# Patient Record
Sex: Female | Born: 1960 | Race: White | Hispanic: No | Marital: Married
Health system: Southern US, Community
[De-identification: ages and names within clinical notes are randomized; demographics above are authoritative.]

---

## 2005-11-09 ENCOUNTER — Other Ambulatory Visit: Admission: RE | Admit: 2005-11-09 | Discharge: 2005-11-09 | Payer: Self-pay | Admitting: Family Medicine

## 2005-11-19 ENCOUNTER — Encounter: Admission: RE | Admit: 2005-11-19 | Discharge: 2005-11-19 | Payer: Self-pay | Admitting: Family Medicine

## 2008-10-11 ENCOUNTER — Other Ambulatory Visit: Admission: RE | Admit: 2008-10-11 | Discharge: 2008-10-11 | Payer: Self-pay | Admitting: Family Medicine

## 2009-10-01 ENCOUNTER — Other Ambulatory Visit: Admission: RE | Admit: 2009-10-01 | Discharge: 2009-10-01 | Payer: Self-pay | Admitting: Family Medicine

## 2009-10-07 ENCOUNTER — Encounter: Admission: RE | Admit: 2009-10-07 | Discharge: 2009-10-07 | Payer: Self-pay | Admitting: Family Medicine

## 2009-10-15 ENCOUNTER — Encounter: Admission: RE | Admit: 2009-10-15 | Discharge: 2009-10-15 | Payer: Self-pay | Admitting: Family Medicine

## 2009-10-20 ENCOUNTER — Encounter: Admission: RE | Admit: 2009-10-20 | Discharge: 2009-10-20 | Payer: Self-pay | Admitting: Family Medicine

## 2009-12-05 ENCOUNTER — Encounter: Admission: RE | Admit: 2009-12-05 | Discharge: 2009-12-05 | Payer: Self-pay | Admitting: Surgery

## 2009-12-05 ENCOUNTER — Ambulatory Visit (HOSPITAL_COMMUNITY): Admission: RE | Admit: 2009-12-05 | Discharge: 2009-12-05 | Payer: Self-pay | Admitting: Surgery

## 2010-04-22 LAB — CBC
HCT: 36.2 % (ref 36.0–46.0)
Hemoglobin: 12.1 g/dL (ref 12.0–15.0)
MCV: 87.4 fL (ref 78.0–100.0)
RBC: 4.14 MIL/uL (ref 3.87–5.11)
WBC: 5.1 10*3/uL (ref 4.0–10.5)

## 2010-08-20 ENCOUNTER — Other Ambulatory Visit: Payer: Self-pay | Admitting: Family Medicine

## 2010-08-20 DIAGNOSIS — N644 Mastodynia: Secondary | ICD-10-CM

## 2010-09-15 ENCOUNTER — Other Ambulatory Visit (HOSPITAL_COMMUNITY)
Admission: RE | Admit: 2010-09-15 | Discharge: 2010-09-15 | Disposition: A | Discharge status: Home / Self Care | Payer: BC Managed Care – PPO | Source: Ambulatory Visit | Attending: Family Medicine | Admitting: Family Medicine

## 2010-09-15 ENCOUNTER — Other Ambulatory Visit: Payer: Self-pay | Admitting: Family Medicine

## 2010-09-15 DIAGNOSIS — Z0000 Encounter for general adult medical examination without abnormal findings: Secondary | ICD-10-CM | POA: Insufficient documentation

## 2010-09-16 ENCOUNTER — Ambulatory Visit
Admission: RE | Admit: 2010-09-16 | Discharge: 2010-09-16 | Disposition: A | Discharge status: Home / Self Care | Payer: Self-pay | Source: Ambulatory Visit | Attending: Family Medicine | Admitting: Family Medicine

## 2010-09-16 DIAGNOSIS — N644 Mastodynia: Secondary | ICD-10-CM

## 2011-08-19 ENCOUNTER — Other Ambulatory Visit: Payer: Self-pay | Admitting: Family Medicine

## 2011-08-19 DIAGNOSIS — Z1231 Encounter for screening mammogram for malignant neoplasm of breast: Secondary | ICD-10-CM

## 2011-09-20 ENCOUNTER — Ambulatory Visit
Admission: RE | Admit: 2011-09-20 | Discharge: 2011-09-20 | Disposition: A | Discharge status: Home / Self Care | Payer: 59 | Source: Ambulatory Visit | Attending: Family Medicine | Admitting: Family Medicine

## 2011-09-20 DIAGNOSIS — Z1231 Encounter for screening mammogram for malignant neoplasm of breast: Secondary | ICD-10-CM

## 2012-08-30 ENCOUNTER — Other Ambulatory Visit: Payer: Self-pay

## 2012-08-30 DIAGNOSIS — Z1231 Encounter for screening mammogram for malignant neoplasm of breast: Secondary | ICD-10-CM

## 2012-09-20 ENCOUNTER — Ambulatory Visit
Admission: RE | Admit: 2012-09-20 | Discharge: 2012-09-20 | Disposition: A | Discharge status: Home / Self Care | Payer: Self-pay | Source: Ambulatory Visit

## 2012-09-20 DIAGNOSIS — Z1231 Encounter for screening mammogram for malignant neoplasm of breast: Secondary | ICD-10-CM

## 2013-09-27 ENCOUNTER — Other Ambulatory Visit: Payer: Self-pay

## 2013-09-27 DIAGNOSIS — Z1231 Encounter for screening mammogram for malignant neoplasm of breast: Secondary | ICD-10-CM

## 2013-10-08 ENCOUNTER — Ambulatory Visit
Admission: RE | Admit: 2013-10-08 | Discharge: 2013-10-08 | Disposition: A | Discharge status: Home / Self Care | Payer: BC Managed Care – PPO | Source: Ambulatory Visit

## 2013-10-08 DIAGNOSIS — Z1231 Encounter for screening mammogram for malignant neoplasm of breast: Secondary | ICD-10-CM

## 2013-11-28 ENCOUNTER — Other Ambulatory Visit: Payer: Self-pay | Admitting: Family Medicine

## 2013-11-28 ENCOUNTER — Other Ambulatory Visit (HOSPITAL_COMMUNITY)
Admission: RE | Admit: 2013-11-28 | Discharge: 2013-11-28 | Disposition: A | Discharge status: Home / Self Care | Payer: BC Managed Care – PPO | Source: Ambulatory Visit | Attending: Family Medicine | Admitting: Family Medicine

## 2013-11-28 DIAGNOSIS — Z0000 Encounter for general adult medical examination without abnormal findings: Secondary | ICD-10-CM | POA: Insufficient documentation

## 2013-11-29 LAB — CYTOLOGY - PAP

## 2014-09-16 ENCOUNTER — Other Ambulatory Visit: Payer: Self-pay

## 2014-09-16 DIAGNOSIS — Z1231 Encounter for screening mammogram for malignant neoplasm of breast: Secondary | ICD-10-CM

## 2014-10-11 ENCOUNTER — Ambulatory Visit: Payer: BC Managed Care – PPO

## 2014-10-25 ENCOUNTER — Ambulatory Visit
Admission: RE | Admit: 2014-10-25 | Discharge: 2014-10-25 | Disposition: A | Discharge status: Home / Self Care | Payer: BC Managed Care – PPO | Source: Ambulatory Visit

## 2014-10-25 DIAGNOSIS — Z1231 Encounter for screening mammogram for malignant neoplasm of breast: Secondary | ICD-10-CM

## 2015-07-22 ENCOUNTER — Other Ambulatory Visit: Payer: Self-pay | Admitting: Family Medicine

## 2015-07-22 DIAGNOSIS — Z1231 Encounter for screening mammogram for malignant neoplasm of breast: Secondary | ICD-10-CM

## 2015-10-31 ENCOUNTER — Ambulatory Visit
Admission: RE | Admit: 2015-10-31 | Discharge: 2015-10-31 | Disposition: A | Discharge status: Home / Self Care | Payer: BC Managed Care – PPO | Source: Ambulatory Visit | Attending: Family Medicine | Admitting: Family Medicine

## 2015-10-31 DIAGNOSIS — Z1231 Encounter for screening mammogram for malignant neoplasm of breast: Secondary | ICD-10-CM

## 2016-01-28 ENCOUNTER — Other Ambulatory Visit (HOSPITAL_COMMUNITY)
Admission: RE | Admit: 2016-01-28 | Discharge: 2016-01-28 | Disposition: A | Discharge status: Home / Self Care | Payer: BC Managed Care – PPO | Source: Ambulatory Visit | Attending: Family Medicine | Admitting: Family Medicine

## 2016-01-28 ENCOUNTER — Other Ambulatory Visit: Payer: Self-pay | Admitting: Family Medicine

## 2016-01-28 DIAGNOSIS — Z1151 Encounter for screening for human papillomavirus (HPV): Secondary | ICD-10-CM | POA: Insufficient documentation

## 2016-01-28 DIAGNOSIS — Z01411 Encounter for gynecological examination (general) (routine) with abnormal findings: Secondary | ICD-10-CM | POA: Diagnosis present

## 2016-01-30 LAB — CYTOLOGY - PAP
Diagnosis: NEGATIVE
HPV: NOT DETECTED

## 2016-09-13 ENCOUNTER — Ambulatory Visit: Payer: BC Managed Care – PPO

## 2016-09-14 ENCOUNTER — Ambulatory Visit (INDEPENDENT_AMBULATORY_CARE_PROVIDER_SITE_OTHER): Payer: BC Managed Care – PPO | Admitting: Physical Therapy

## 2016-09-14 DIAGNOSIS — M25522 Pain in left elbow: Secondary | ICD-10-CM | POA: Diagnosis not present

## 2016-09-14 DIAGNOSIS — M79632 Pain in left forearm: Secondary | ICD-10-CM

## 2016-09-14 DIAGNOSIS — M25632 Stiffness of left wrist, not elsewhere classified: Secondary | ICD-10-CM | POA: Diagnosis not present

## 2016-09-14 NOTE — Patient Instructions (Signed)
Composite Extension (Passive Flexor Stretch)    Sitting with elbows on table and palms together, slowly lower wrists toward table until stretch is felt. Be sure to keep palms together throughout stretch. Hold _30___ seconds. Relax. Repeat __3__ times. Do __2-3__ sessions per day.  Wrist Passive Flexion    Rest right forearm on table, hand palm-down over edge. Bend wrist by pressing hand down with other hand. Hold __30__ seconds. Repeat _3___ times. Do __2-3__ sessions per day.  Forearm Pronation / Supination: Resisted (Sitting)    With right forearm supported, grasp object and gently rotate palm up, then down, as far as possible without pain. Repeat _10-15___ times per set. Do __1__ sets per session. Do __2-3__ sessions per day.  Towel Roll Squeeze    With right forearm resting on surface, gently squeeze towel. Repeat __10-15__ times per set. Do _1___ sets per session. Do __2-3__ sessions per day.

## 2016-09-15 NOTE — Therapy (Addendum)
[facility] [address] [place], [place], [address] Phone: [phone]   Fax:  [phone]  Physical Therapy Evaluation  Patient Details  Name: Darlene RoverBarbara B Davila MRN: [id number] Date of Birth: Dec 14, 1960 Referring Provider: Dr. Deatra JamesVyvyan Sun  Encounter Date: 09/14/2016      PT End of Session - 09/15/16 0857    Visit Number 1   Number of Visits 6   Date for PT Re-Evaluation 10/26/16   Authorization Type BCBS   PT Start Time 1604   PT Stop Time 1643   PT Time Calculation (min) 39 min   Activity Tolerance Patient tolerated treatment well   Behavior [facility] for tasks assessed/performed      No past medical history on file.  No past surgical history on file.  There were no vitals filed for this visit.       Subjective Assessment - 09/14/16 1609    Subjective Pt is a 56 y/o female who presents to OPPT for LUE pain following fall on 07/23/16.  X-rays negative for fracture.  Pt reports continued pain and tenderness as well as ROM limitations.  Pt reports occasional numbness mostly in 5th digit.   Limitations Other (comment)  ADLs   Diagnostic tests x-rays negative   Patient Stated Goals restore ROM, improve pain    Currently in Pain? Yes   Pain Score 0-No pain   Pain Location Elbow   Pain Orientation Left   Pain Descriptors / Indicators Pins and needles   Pain Type Chronic pain;Acute pain   Pain Onset More than a month ago   Pain Frequency Intermittent   Aggravating Factors  sudden movement with hand, pronation/supination, playing piano   Pain Relieving Factors avoiding movement            [facility] PT Assessment - 09/14/16 1615      Assessment   Medical Diagnosis Lt forearm contusion   Referring Provider Dr. Deatra JamesVyvyan Sun   Onset Date/Surgical Date 07/23/16   Hand Dominance Right   Next MD Visit PRN   Prior Therapy none     Precautions   Precautions None     Restrictions   Weight Bearing Restrictions No      Balance Screen   Has the patient fallen in the past 6 months Yes   How many times? 1   Has the patient had a decrease in activity level because of a fear of falling?  Yes   Is the patient reluctant to leave their home because of a fear of falling?  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 [employer]   Leisure reading, walking, piano, hiking     Cognition   Overall Cognitive Status Within Functional Limits for tasks assessed     Posture/Postural Control   Posture/Postural Control Postural limitations   Postural Limitations Rounded Shoulders;Forward head     AROM   Overall AROM Comments Rt wrist WNL   AROM Assessment Site Wrist;Forearm   Right/Left Forearm Left   Left Forearm Pronation 68 Degrees   Left Forearm Supination 80 Degrees   Right/Left Wrist Left   Left Wrist Extension 40 Degrees   Left Wrist Flexion 37 Degrees   Left Wrist Radial Deviation 9 Degrees   Left Wrist Ulnar Deviation 17  Degrees     Strength   Strength Assessment Site Hand   Right/Left hand Right;Left   Right Hand Grip (lbs) 69.67  76, 68, 65   Left Hand Grip (lbs) 37  35, 38, 38     Palpation   Palpation comment tenderness Lt distal elbow            Objective measurements completed on examination: See above findings.          [facility] Adult PT Treatment/Exercise - 09/14/16 1615      Exercises   Exercises Wrist;Hand     Hand Exercises   Other Hand Exercises instructed in towel squeeze for HEP     Wrist Exercises   Forearm Supination AAROM;Left;5 reps   Forearm Supination Limitations for HEP instruction   Forearm Pronation AAROM;Left;5 reps   Forearm Pronation Limitations for HEP instruction   Other wrist exercises wrist flexion/extension stretch 1 rep x 30 sec on Lt for HEP  instruction                PT Education - 09/15/16 0855    Education provided Yes   Education Details HEP   Person(s) Educated Patient   Methods Explanation;Demonstration;Handout;Verbal cues   Comprehension Verbalized understanding;Returned demonstration;Need further instruction             PT Long Term Goals - 09/15/16 0909      PT LONG TERM GOAL #1   Title independent with HEP   Time 6   Period Weeks   Status New   Target Date 10/26/16     PT LONG TERM GOAL #2   Title improve Lt grip strength to at least 45# for improved strength   Time 6   Period Weeks   Status New   Target Date 10/26/16     PT LONG TERM GOAL #3   Title improve Lt wrist flexion and extension to at least 50 degrees for improved function   Time 6   Period Weeks   Status New   Target Date 10/26/16     PT LONG TERM GOAL #4   Title improve Lt forearm supination/pronation to WNL for improved function   Time 6   Period Weeks   Status New   Target Date 10/26/16                Plan - 09/15/16 0900    Clinical Impression Statement Pt is a 56 y/o female who presents to OPPT s/p fall resulting in LUE pain and decreased use.  Pt demonstrates pain, decreased strength and ROM, and decreased use of LUE affecting ADLs.  Pt will benefit from PT to address these deficits.   Clinical Presentation Stable   Clinical Decision Making Low   Rehab Potential Good   Clinical Impairments Affecting Rehab Potential pt requesting limited visits due to finances and work   PT Frequency 1x / week  pt recommending biweekly with option to see 1x/wk depending on progress   PT Duration 6 weeks   PT Treatment/Interventions ADLs/Self Care Home Management;Cryotherapy;Electrical Stimulation;Moist Heat;Ultrasound;Therapeutic exercise;Therapeutic activities;Functional mobility training;Patient/family education;Manual techniques;Taping;Passive range of motion;Dry needling   PT Next Visit Plan review HEP, manual for  ROM, modalities and strengthening PRN   Consulted and Agree with Plan of Care Patient      Patient will benefit from skilled therapeutic intervention in order to improve the following deficits and impairments:  Decreased strength, Pain, Impaired UE functional use, Decreased range of motion, Impaired flexibility, Increased edema  Visit  Diagnosis: Pain in left elbow - Plan: PT plan of care cert/re-cert  Pain in left forearm - Plan: PT plan of care cert/re-cert  Stiffness of left wrist, not elsewhere classified - Plan: PT plan of care cert/re-cert     Problem List There are no active problems to display for this patient.    Clarita Crane, PT, DPT 09/15/16 9:27 AM    [facility] [address] [place], [place], [address] Phone: [phone]   Fax:  [phone]  Name: NADELYN ENRIQUES MRN: [id number] Date of Birth: [birth year]

## 2016-09-21 ENCOUNTER — Other Ambulatory Visit: Payer: Self-pay | Admitting: Family Medicine

## 2016-09-21 DIAGNOSIS — Z1231 Encounter for screening mammogram for malignant neoplasm of breast: Secondary | ICD-10-CM

## 2016-09-27 ENCOUNTER — Encounter: Payer: Self-pay | Admitting: Physical Therapy

## 2016-09-27 ENCOUNTER — Ambulatory Visit (INDEPENDENT_AMBULATORY_CARE_PROVIDER_SITE_OTHER): Payer: BC Managed Care – PPO | Admitting: Physical Therapy

## 2016-09-27 DIAGNOSIS — M25522 Pain in left elbow: Secondary | ICD-10-CM

## 2016-09-27 DIAGNOSIS — M79632 Pain in left forearm: Secondary | ICD-10-CM | POA: Diagnosis not present

## 2016-09-27 DIAGNOSIS — M25632 Stiffness of left wrist, not elsewhere classified: Secondary | ICD-10-CM | POA: Diagnosis not present

## 2016-09-27 NOTE — Therapy (Signed)
[facility] [address] [place], [place], [address] Phone: [phone]   Fax:  [phone]  Physical Therapy Treatment  Patient Details  Name: Darlene Davila MRN: [id number] Date of Birth: July 24, 1960 Referring Provider: Dr. Deatra James  Encounter Date: 09/27/2016      PT End of Session - 09/27/16 1700    Visit Number 2   Number of Visits 6   Date for PT Re-Evaluation 10/26/16   Authorization Type BCBS   PT Start Time 1605   PT Stop Time 1648   PT Time Calculation (min) 43 min   Activity Tolerance Patient tolerated treatment well   Behavior During Therapy [facility] for tasks assessed/performed      History reviewed. No pertinent past medical history.  History reviewed. No pertinent surgical history.  There were no vitals filed for this visit.      Subjective Assessment - 09/27/16 1626    Subjective Pt has been doing HEP, feels that she is doing better with ROM, and is using hand some at home as well.    Currently in Pain? Yes   Pain Score 2    Pain Location Elbow   Pain Descriptors / Indicators Aching   Pain Onset More than a month ago   Pain Frequency Intermittent   Aggravating Factors  increased activity, piano, pron/sup and wrist flexion   Pain Relieving Factors rest                         [facility] Adult PT Treatment/Exercise - 09/27/16 1630      Elbow Exercises   Elbow Flexion Strengthening;Left;20 reps;Standing   Elbow Flexion Limitations 1lb   Forearm Supination AROM;Left;20 reps   Forearm Pronation AROM;20 reps   Wrist Flexion AROM;20 reps   Wrist Extension AROM;20 reps   Other elbow exercises Rows, Red t-band, standing x 20      Wrist Exercises   Other wrist exercises Wrist flexion stretch/off table 30 sec x 3      Manual Therapy   Manual Therapy Passive ROM   Passive ROM L elbow and wrist, all motions                PT Education - 09/27/16 1700    Education provided Yes   Person(s) Educated Patient   Methods Explanation;Demonstration   Comprehension Verbalized understanding;Need further instruction             PT Long Term Goals - 09/15/16 0909      PT LONG TERM GOAL #1   Title independent with HEP   Time 6   Period Weeks   Status New   Target Date 10/26/16     PT LONG TERM GOAL #2   Title improve Lt grip strength to at least 45# for improved strength   Time 6   Period Weeks   Status New   Target Date 10/26/16     PT LONG TERM GOAL #3   Title improve Lt wrist flexion and extension to at least 50 degrees for improved function   Time 6   Period Weeks   Status New   Target Date 10/26/16     PT LONG TERM GOAL #4   Title improve Lt forearm supination/pronation to WNL for improved function   Time 6   Period Weeks   Status New   Target Date 10/26/16               Plan -  09/27/16 1701    Clinical Impression Statement Pt continues to have stiffness in elbow and wrist, addressed with manual therapy and ther ex today. She has soreness at lateral elbow and dorsal wrist with ther ex and stretching. Pt given further instruction on HEP with recommendations to continue to focus on stretching and ROM.    Rehab Potential Good   Clinical Impairments Affecting Rehab Potential pt requesting limited visits due to finances and work   PT Frequency 1x / week  pt recommending biweekly with option to see 1x/wk depending on progress   PT Duration 6 weeks   PT Treatment/Interventions ADLs/Self Care Home Management;Cryotherapy;Electrical Stimulation;Moist Heat;Ultrasound;Therapeutic exercise;Therapeutic activities;Functional mobility training;Patient/family education;Manual techniques;Taping;Passive range of motion;Dry needling   PT Next Visit Plan review HEP, manual for ROM, modalities and strengthening PRN   Consulted and Agree with Plan of Care Patient      Patient will benefit from skilled therapeutic intervention in order to improve the following  deficits and impairments:  Decreased strength, Pain, Impaired UE functional use, Decreased range of motion, Impaired flexibility, Increased edema  Visit Diagnosis: Pain in left elbow  Pain in left forearm  Stiffness of left wrist, not elsewhere classified     Problem List There are no active problems to display for this patient.   Sedalia Muta, PT, DPT  09/27/2016, 5:04 PM      [facility] [address] [place], [place], [address] Phone: [phone]   Fax:  [phone]  Name: RANEA RENEGAR MRN: [id number] Date of Birth: Mar 01, 1960

## 2016-10-13 ENCOUNTER — Ambulatory Visit (INDEPENDENT_AMBULATORY_CARE_PROVIDER_SITE_OTHER): Payer: BC Managed Care – PPO | Admitting: Physical Therapy

## 2016-10-13 DIAGNOSIS — M79632 Pain in left forearm: Secondary | ICD-10-CM

## 2016-10-13 DIAGNOSIS — M25522 Pain in left elbow: Secondary | ICD-10-CM

## 2016-10-13 NOTE — Therapy (Signed)
[facility] [address] [place], [place], [address] Phone: [phone]   Fax:  [phone]  Physical Therapy Treatment  Patient Details  Name: Darlene Davila MRN: [id number] Date of Birth: [birth year] Referring Provider: Dr. Deatra James  Encounter Date: 10/13/2016      PT End of Session - 10/13/16 1621    Visit Number 3   Number of Visits 6   Date for PT Re-Evaluation 10/26/16   Authorization Type BCBS   PT Start Time 1515   PT Stop Time 1558   PT Time Calculation (min) 43 min   Activity Tolerance Patient tolerated treatment well   Behavior During Therapy [facility] for tasks assessed/performed      No past medical history on file.  No past surgical history on file.  There were no vitals filed for this visit.      Subjective Assessment - 10/13/16 1617    Subjective Pt states improvements in hand and wrist, with movement and with pain. She has had days recently that she "forgets about it", and has been able to work regular job duties without difficulty.    Currently in Pain? No/denies            [facility] PT Assessment - 10/13/16 0001      AROM   Right/Left Forearm Left   Left Forearm Pronation 76 Degrees   Left Forearm Supination 85 Degrees   Right/Left Wrist Left   Left Wrist Extension 60 Degrees   Left Wrist Flexion 52 Degrees     Strength   Left Hand Grip (lbs) 45                     [facility] Adult PT Treatment/Exercise - 10/13/16 0001      Exercises   Exercises Shoulder     Elbow Exercises   Elbow Flexion Limitations 2lb   Forearm Supination AROM;Left;20 reps   Forearm Supination Limitations with 1lb weight   Forearm Pronation AROM;20 reps   Forearm Pronation Limitations with 1 lb weight   Wrist Flexion AROM;20 reps   Wrist Extension AROM;20 reps   Bar Weights/Barbell (Wrist Extension) 1 lb   Other elbow exercises Rows, Red t-band, standing x 20      Shoulder Exercises: Standing   Flexion  AROM;15 reps;Weights   Shoulder Flexion Weight (lbs) 1lb     Shoulder Exercises: Stretch   Elbow Flexion Strengthening;Left;20 reps;Standing     Wrist Exercises   Other wrist exercises Wrist flexion stretch/off table 30 sec x 3      Manual Therapy   Manual Therapy Passive ROM;Joint mobilization   Joint Mobilization For wrist flexion and extension, grade 3   Passive ROM L elbow and wrist, all motions                PT Education - 10/13/16 1621    Education provided Yes   Education Details Review and progression of HEP, pt instructed to use 1lb weight for wrist and elbow strengthening.    Person(s) Educated Patient   Methods Explanation;Demonstration   Comprehension Verbalized understanding;Verbal cues required             PT Long Term Goals - 09/15/16 0909      PT LONG TERM GOAL #1   Title independent with HEP   Time 6   Period Weeks   Status New   Target Date 10/26/16     PT LONG TERM GOAL #2   Title improve Lt  grip strength to at least 45# for improved strength   Time 6   Period Weeks   Status New   Target Date 10/26/16     PT LONG TERM GOAL #3   Title improve Lt wrist flexion and extension to at least 50 degrees for improved function   Time 6   Period Weeks   Status New   Target Date 10/26/16     PT LONG TERM GOAL #4   Title improve Lt forearm supination/pronation to WNL for improved function   Time 6   Period Weeks   Status New   Target Date 10/26/16               Plan - 10/13/16 1622    Clinical Impression Statement Pt showing very good improvements in ROM for elbow and wrist. She has much improvement with all motions, but still lacking full supination/pronation, and wrist flexion. She has only mild soreness with increased activity. She was progressed to light strengthening today, without increased pain. Importance of continuing stretching for HEP was reviewed today, to improve ROM. Pt has been diligent with HEP. She requests to come  back one more visit (in 2 weeks), plan to potentially d/c at that time, if pt continues to make good improvements.    Rehab Potential Good   Clinical Impairments Affecting Rehab Potential pt requesting limited visits due to finances and work   PT Frequency 1x / week  pt recommending biweekly with option to see 1x/wk depending on progress   PT Duration 6 weeks   PT Treatment/Interventions ADLs/Self Care Home Management;Cryotherapy;Electrical Stimulation;Moist Heat;Ultrasound;Therapeutic exercise;Therapeutic activities;Functional mobility training;Patient/family education;Manual techniques;Taping;Passive range of motion;Dry needling   PT Next Visit Plan progress strength as able, ensure Independence with HEP   Consulted and Agree with Plan of Care Patient      Patient will benefit from skilled therapeutic intervention in order to improve the following deficits and impairments:  Decreased strength, Pain, Impaired UE functional use, Decreased range of motion, Impaired flexibility, Increased edema  Visit Diagnosis: Pain in left elbow  Pain in left forearm     Problem List There are no active problems to display for this patient.   Sedalia MutaLauren Carynn Felling, PT, DPT 4:29 PM  10/13/16    [facility] [address] [place], [place], [address] Phone: [phone]   Fax:  [phone]  Name: Darlene RoverBarbara B Sammons MRN: [id number] Date of Birth: 30-Mar-1960

## 2016-11-01 ENCOUNTER — Ambulatory Visit
Admission: RE | Admit: 2016-11-01 | Discharge: 2016-11-01 | Disposition: A | Discharge status: Home / Self Care | Payer: BC Managed Care – PPO | Source: Ambulatory Visit | Attending: Family Medicine | Admitting: Family Medicine

## 2016-11-01 DIAGNOSIS — Z1231 Encounter for screening mammogram for malignant neoplasm of breast: Secondary | ICD-10-CM

## 2016-11-02 ENCOUNTER — Ambulatory Visit (INDEPENDENT_AMBULATORY_CARE_PROVIDER_SITE_OTHER): Payer: BC Managed Care – PPO | Admitting: Physical Therapy

## 2016-11-02 ENCOUNTER — Encounter: Payer: Self-pay | Admitting: Physical Therapy

## 2016-11-02 DIAGNOSIS — M25522 Pain in left elbow: Secondary | ICD-10-CM

## 2016-11-02 DIAGNOSIS — M25632 Stiffness of left wrist, not elsewhere classified: Secondary | ICD-10-CM

## 2016-11-02 DIAGNOSIS — M79632 Pain in left forearm: Secondary | ICD-10-CM

## 2016-11-03 NOTE — Therapy (Signed)
[facility] [address] [place], [place], [address] Phone: [phone]   Fax:  [phone]  Physical Therapy Treatment/Discharge  Patient Details  Name: ROWENE SUTO MRN: [id number] Date of Birth: [birth year] Referring Provider: Dr. Donald Prose  Encounter Date: 11/02/2016      PT End of Session - 11/03/16 1132    Visit Number 4   Number of Visits 6   Date for PT Re-Evaluation 10/26/16   Authorization Type BCBS   PT Start Time 1518   PT Stop Time 1558   PT Time Calculation (min) 40 min   Activity Tolerance Patient tolerated treatment well   Behavior During Therapy [facility] for tasks assessed/performed      History reviewed. No pertinent past medical history.  History reviewed. No pertinent surgical history.  There were no vitals filed for this visit.      Subjective Assessment - 11/02/16 1522    Subjective Pt last seen 10/13/16. She states that she has been doing HEP, but has been a bit more lax on it than she should be. She feels that she has made good progress, and is able to do all regular duties at work and home at this time. She states mild soreness at times, with increased activity, soreness in lateral elbow.    Currently in Pain? Yes   Pain Score 1    Pain Location Elbow   Pain Orientation Left   Pain Descriptors / Indicators Aching   Pain Type Chronic pain   Pain Onset More than a month ago   Pain Frequency Intermittent                                 PT Education - 11/02/16 1523    Education provided Yes   Education Details Plan for D/C and plan for continuing final HEP   Person(s) Educated Patient   Methods Explanation   Comprehension Verbalized understanding             PT Long Term Goals - 11/03/16 1133      PT LONG TERM GOAL #1   Title independent with HEP   Status Achieved     PT LONG TERM GOAL #2   Title improve Lt grip strength to at least 45# for improved  strength   Status Achieved     PT LONG TERM GOAL #3   Title improve Lt wrist flexion and extension to at least 50 degrees for improved function   Status Achieved     PT LONG TERM GOAL #4   Title improve Lt forearm supination/pronation to WNL for improved function   Status Partially Met               Plan - 11/03/16 1134    Clinical Impression Statement Pt has shown good improvements. She has improved ROM with all motions. She continues to have mild end range stiffness for wrist flexion, as well as supination, but both are [facility]. She will continue to address ROM, strength, and grip strength with HEP. Final HEP reviewed in detail today. Pt with no remaining functional deficits that require continued care. Pt in agreement with d/c plan. She has some concern for mild soreness intermittently, which is likely due to overuse, and having weakness in forearm. Recommended that she follow up with Dr. Paulla Fore for consult if pain does not improve in 2-3 months.    Rehab Potential Good  Clinical Impairments Affecting Rehab Potential pt requesting limited visits due to finances and work   PT Frequency 1x / week  pt recommending biweekly with option to see 1x/wk depending on progress   PT Duration 6 weeks   PT Treatment/Interventions ADLs/Self Care Home Management;Cryotherapy;Electrical Stimulation;Moist Heat;Ultrasound;Therapeutic exercise;Therapeutic activities;Functional mobility training;Patient/family education;Manual techniques;Taping;Passive range of motion;Dry needling   PT Next Visit Plan d/c to HEP    Consulted and Agree with Plan of Care Patient      Patient will benefit from skilled therapeutic intervention in order to improve the following deficits and impairments:  Decreased strength, Pain, Impaired UE functional use, Decreased range of motion, Impaired flexibility, Increased edema       Visit Diagnosis: Pain in left elbow  Stiffness of left wrist, not elsewhere  classified  Pain in left forearm        Problem List There are no active problems to display for this patient.  Lyndee Hensen, PT, DPT 11:45 AM  11/03/16    [facility] [address] [place], [place], [address] Phone: [phone]   Fax:  [phone]  Name: JOYCEANN KRUSER MRN: [id number] Date of Birth: July 10, 1960   PHYSICAL THERAPY DISCHARGE SUMMARY  Visits from Start of Care: 4  Current functional level related to goals / functional outcomes: See above   Remaining deficits: See above    Plan: Patient agrees to discharge.  Patient goals were met. Patient is being discharged due to meeting the stated rehab goals.  ?????    Lyndee Hensen, PT, DPT 11:45 AM  11/03/16

## 2017-09-26 ENCOUNTER — Other Ambulatory Visit: Payer: Self-pay | Admitting: Family Medicine

## 2017-09-26 DIAGNOSIS — Z1231 Encounter for screening mammogram for malignant neoplasm of breast: Secondary | ICD-10-CM

## 2017-11-03 ENCOUNTER — Ambulatory Visit
Admission: RE | Admit: 2017-11-03 | Discharge: 2017-11-03 | Disposition: A | Discharge status: Home / Self Care | Payer: BC Managed Care – PPO | Source: Ambulatory Visit | Attending: Family Medicine | Admitting: Family Medicine

## 2017-11-03 DIAGNOSIS — Z1231 Encounter for screening mammogram for malignant neoplasm of breast: Secondary | ICD-10-CM

## 2018-10-04 ENCOUNTER — Other Ambulatory Visit: Payer: Self-pay | Admitting: Family Medicine

## 2018-10-04 DIAGNOSIS — Z1231 Encounter for screening mammogram for malignant neoplasm of breast: Secondary | ICD-10-CM

## 2018-11-22 ENCOUNTER — Other Ambulatory Visit: Payer: Self-pay

## 2018-11-22 ENCOUNTER — Ambulatory Visit
Admission: RE | Admit: 2018-11-22 | Discharge: 2018-11-22 | Disposition: A | Discharge status: Home / Self Care | Payer: BC Managed Care – PPO | Source: Ambulatory Visit | Attending: Family Medicine | Admitting: Family Medicine

## 2018-11-22 DIAGNOSIS — Z1231 Encounter for screening mammogram for malignant neoplasm of breast: Secondary | ICD-10-CM

## 2018-11-24 ENCOUNTER — Other Ambulatory Visit: Payer: Self-pay | Admitting: Family Medicine

## 2018-11-24 DIAGNOSIS — R928 Other abnormal and inconclusive findings on diagnostic imaging of breast: Secondary | ICD-10-CM

## 2018-11-29 ENCOUNTER — Other Ambulatory Visit: Payer: Self-pay

## 2018-11-29 ENCOUNTER — Ambulatory Visit
Admission: RE | Admit: 2018-11-29 | Discharge: 2018-11-29 | Disposition: A | Discharge status: Home / Self Care | Payer: BC Managed Care – PPO | Source: Ambulatory Visit | Attending: Family Medicine | Admitting: Family Medicine

## 2018-11-29 ENCOUNTER — Ambulatory Visit: Payer: BC Managed Care – PPO

## 2018-11-29 DIAGNOSIS — R928 Other abnormal and inconclusive findings on diagnostic imaging of breast: Secondary | ICD-10-CM

## 2019-06-05 ENCOUNTER — Telehealth: Payer: Self-pay | Admitting: Family Medicine

## 2019-06-05 NOTE — Telephone Encounter (Signed)
Received patients written referral for Physical Therapy.

## 2019-06-11 ENCOUNTER — Ambulatory Visit: Payer: BC Managed Care – PPO | Admitting: Physical Therapy

## 2019-10-24 ENCOUNTER — Other Ambulatory Visit: Payer: Self-pay | Admitting: Family Medicine

## 2019-10-24 DIAGNOSIS — Z1231 Encounter for screening mammogram for malignant neoplasm of breast: Secondary | ICD-10-CM

## 2019-11-27 ENCOUNTER — Ambulatory Visit
Admission: RE | Admit: 2019-11-27 | Discharge: 2019-11-27 | Disposition: A | Discharge status: Home / Self Care | Payer: BC Managed Care – PPO | Source: Ambulatory Visit | Attending: Family Medicine | Admitting: Family Medicine

## 2019-11-27 ENCOUNTER — Other Ambulatory Visit: Payer: Self-pay

## 2019-11-27 DIAGNOSIS — Z1231 Encounter for screening mammogram for malignant neoplasm of breast: Secondary | ICD-10-CM

## 2020-10-23 ENCOUNTER — Other Ambulatory Visit: Payer: Self-pay | Admitting: Family Medicine

## 2020-10-23 DIAGNOSIS — Z1231 Encounter for screening mammogram for malignant neoplasm of breast: Secondary | ICD-10-CM

## 2020-12-01 ENCOUNTER — Ambulatory Visit
Admission: RE | Admit: 2020-12-01 | Discharge: 2020-12-01 | Disposition: A | Discharge status: Home / Self Care | Payer: BC Managed Care – PPO | Source: Ambulatory Visit | Attending: Family Medicine | Admitting: Family Medicine

## 2020-12-01 ENCOUNTER — Other Ambulatory Visit: Payer: Self-pay

## 2020-12-01 DIAGNOSIS — Z1231 Encounter for screening mammogram for malignant neoplasm of breast: Secondary | ICD-10-CM

## 2021-10-28 ENCOUNTER — Other Ambulatory Visit: Payer: Self-pay | Admitting: Internal Medicine

## 2021-10-28 ENCOUNTER — Other Ambulatory Visit: Payer: Self-pay | Admitting: *Deleted

## 2021-10-28 ENCOUNTER — Other Ambulatory Visit: Payer: Self-pay | Admitting: Family Medicine

## 2021-10-28 DIAGNOSIS — Z1231 Encounter for screening mammogram for malignant neoplasm of breast: Secondary | ICD-10-CM

## 2021-12-08 ENCOUNTER — Ambulatory Visit
Admission: RE | Admit: 2021-12-08 | Discharge: 2021-12-08 | Disposition: A | Discharge status: Home / Self Care | Payer: BC Managed Care – PPO | Source: Ambulatory Visit | Attending: Internal Medicine | Admitting: Internal Medicine

## 2021-12-08 DIAGNOSIS — Z1231 Encounter for screening mammogram for malignant neoplasm of breast: Secondary | ICD-10-CM

## 2022-03-24 IMAGING — MG MM DIGITAL SCREENING BILAT W/ TOMO AND CAD
6 of 12 series · 6 of 36 positions shown · non-contrast
Comparison: Previous exam(s).

CLINICAL DATA: Screening.

EXAM:
DIGITAL SCREENING BILATERAL MAMMOGRAM WITH TOMOSYNTHESIS AND CAD
TECHNIQUE: Bilateral screening digital craniocaudal and mediolateral oblique
mammograms were obtained. Bilateral screening digital breast
tomosynthesis was performed. The images were evaluated with
computer-aided detection.

[L MLO synth-2D (1 of 2)]
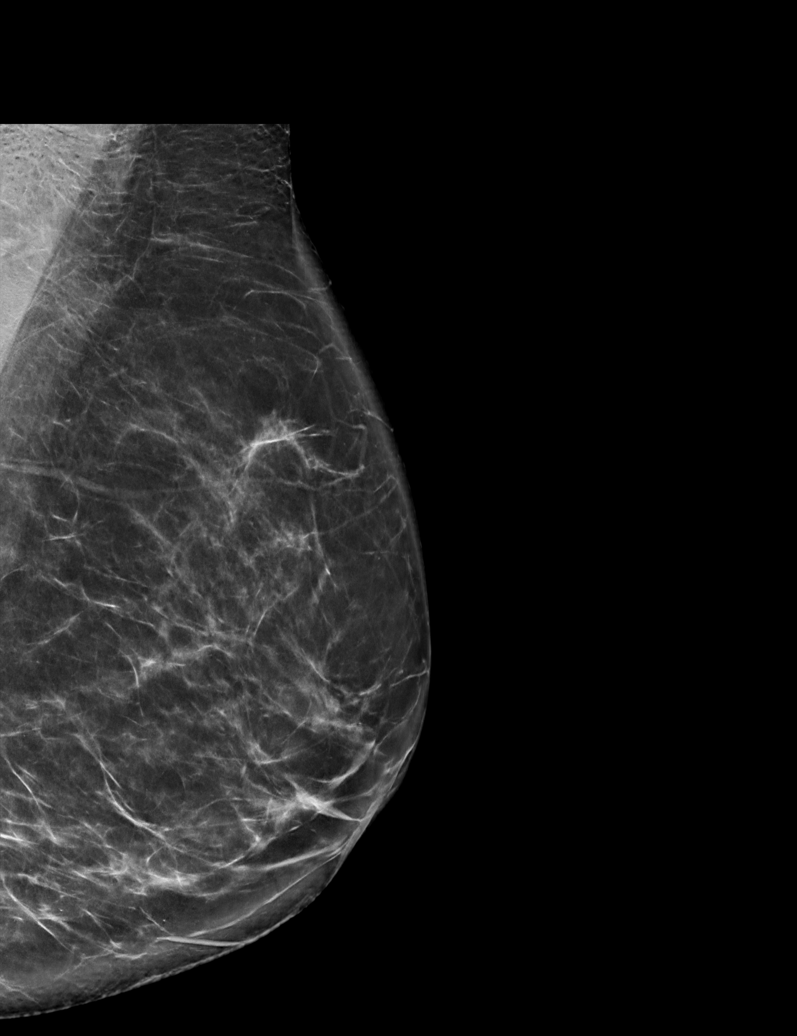

[L MLO synth-2D (2 of 2)]
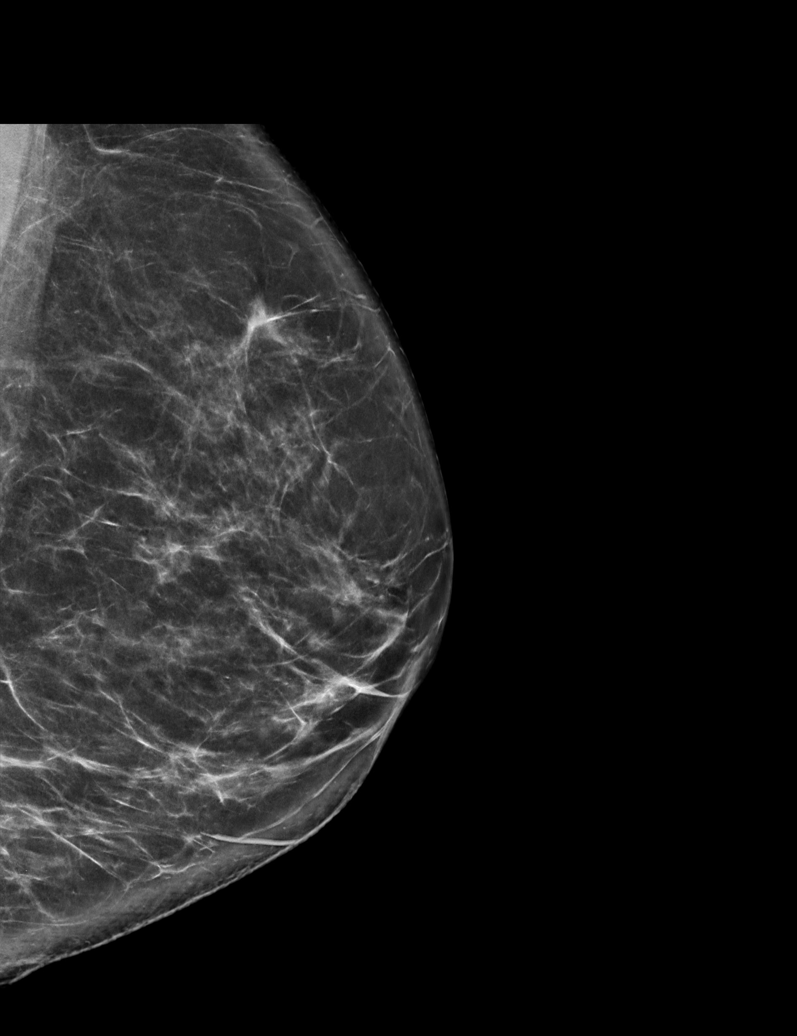

[R MLO synth-2D (1 of 2)]
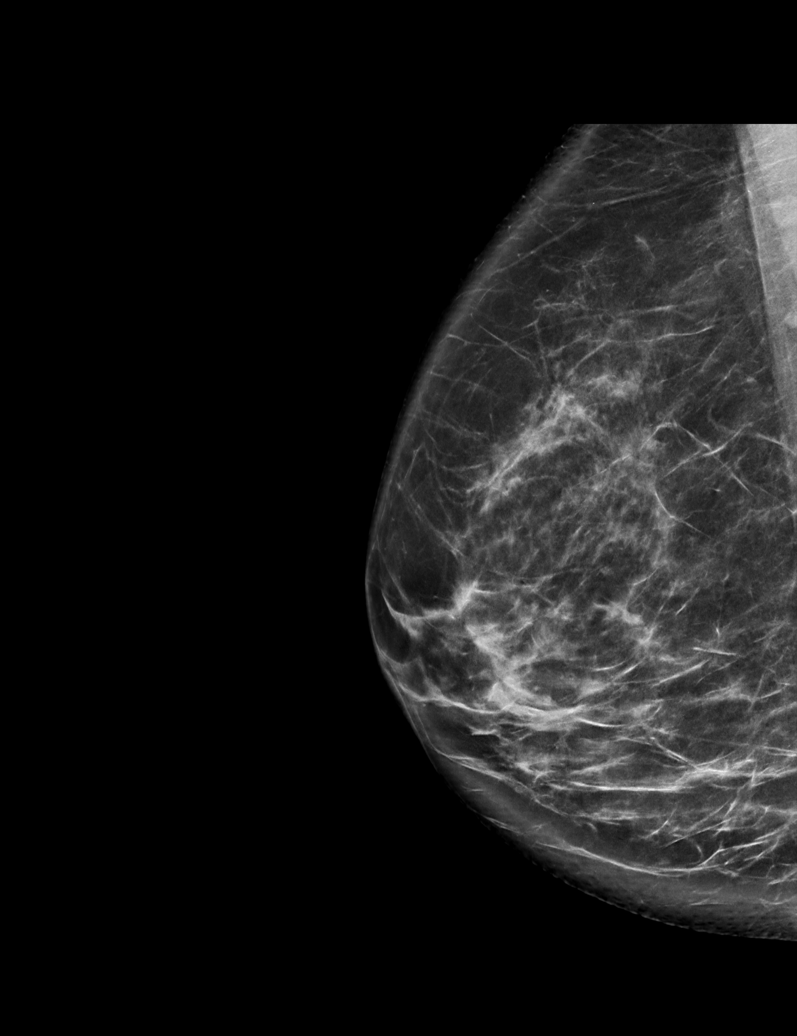

[L CC synth-2D]
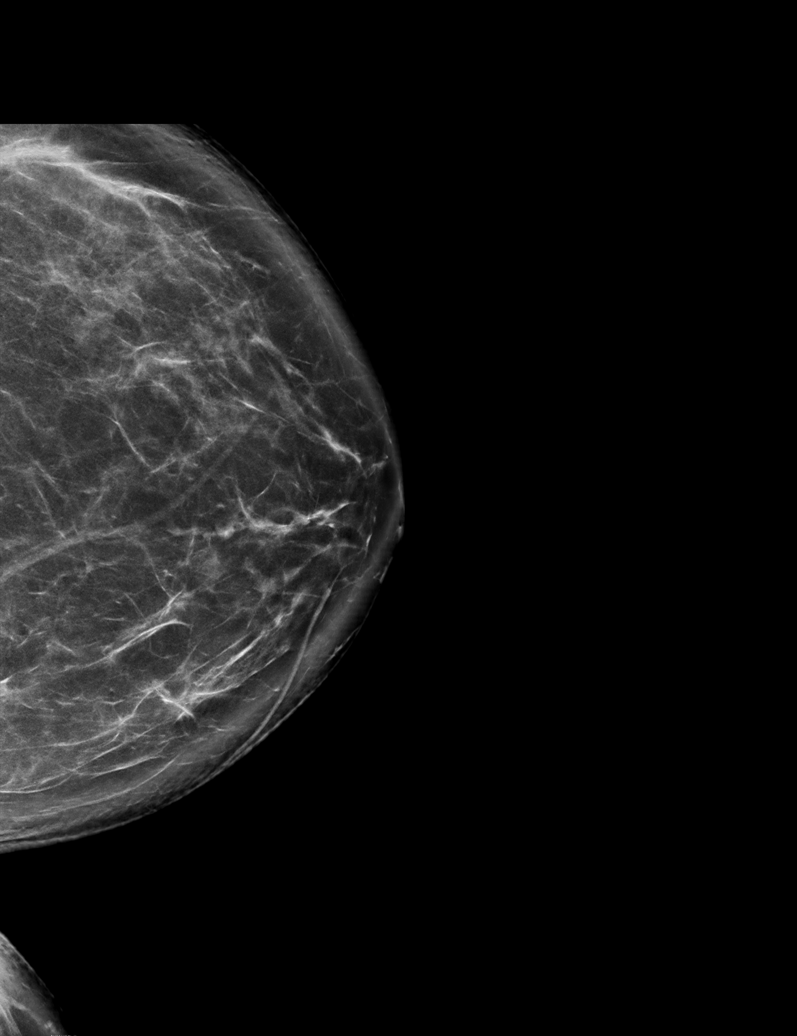

[R CC synth-2D]
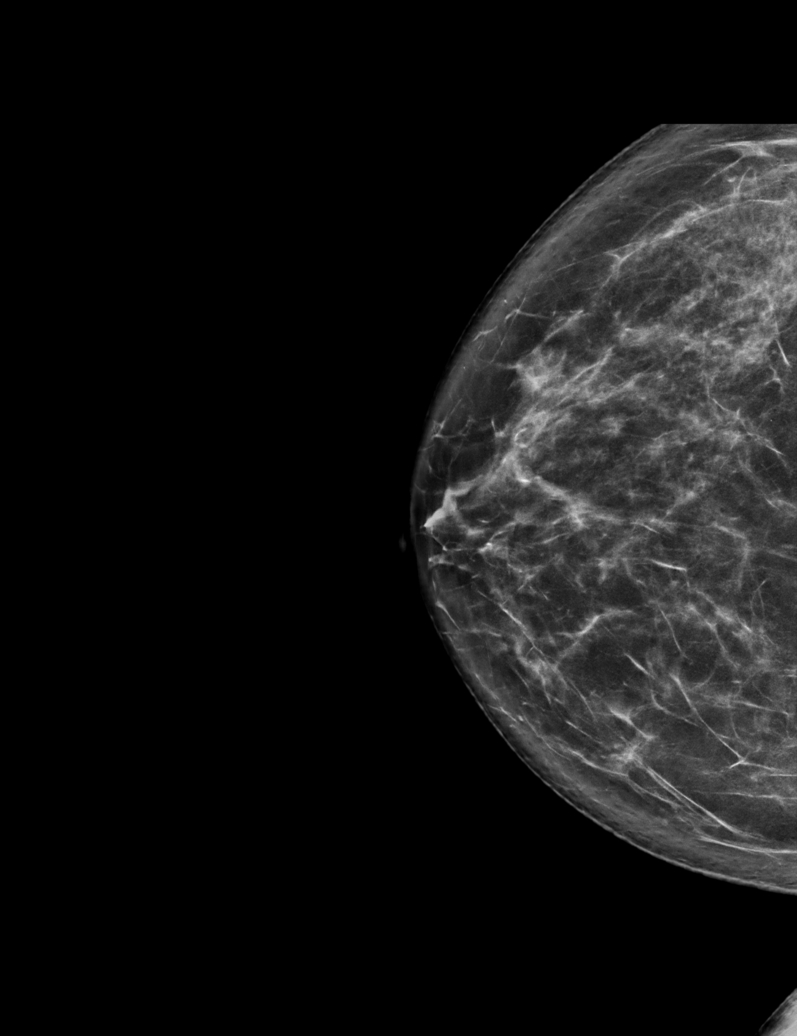

[R MLO synth-2D (2 of 2)]
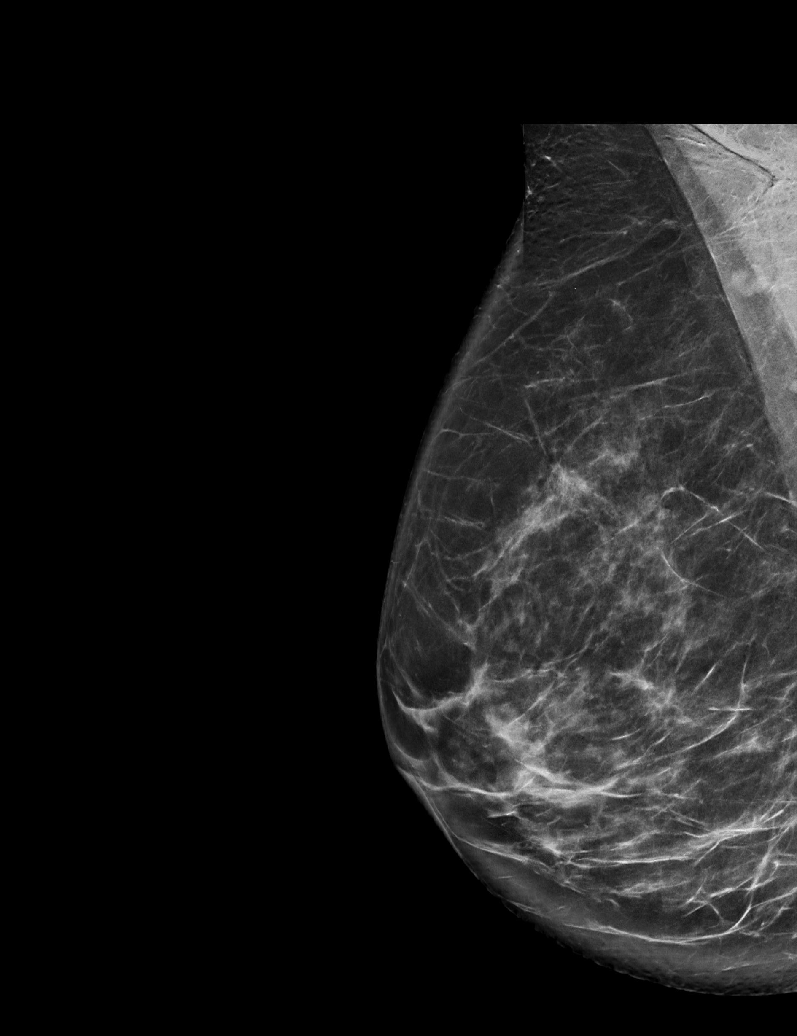

[6 of 36 positions shown; findings below may reference images not displayed]

ACR Breast Density Category b: There are scattered areas of
fibroglandular density.
FINDINGS: There are no findings suspicious for malignancy.
IMPRESSION: No mammographic evidence of malignancy. A result letter of this
screening mammogram will be mailed directly to the patient.

RECOMMENDATION:
Screening mammogram in one year. (Code:51-O-LD2)

BI-RADS CATEGORY  1: Negative.

## 2022-11-15 ENCOUNTER — Other Ambulatory Visit: Payer: Self-pay | Admitting: Family Medicine

## 2022-11-15 DIAGNOSIS — Z1231 Encounter for screening mammogram for malignant neoplasm of breast: Secondary | ICD-10-CM

## 2022-12-13 ENCOUNTER — Ambulatory Visit
Admission: RE | Admit: 2022-12-13 | Discharge: 2022-12-13 | Disposition: A | Discharge status: Home / Self Care | Payer: BC Managed Care – PPO | Source: Ambulatory Visit | Attending: Family Medicine | Admitting: Family Medicine

## 2022-12-13 DIAGNOSIS — Z1231 Encounter for screening mammogram for malignant neoplasm of breast: Secondary | ICD-10-CM

## 2024-01-23 ENCOUNTER — Other Ambulatory Visit: Payer: Self-pay | Admitting: Internal Medicine

## 2024-01-23 DIAGNOSIS — Z1231 Encounter for screening mammogram for malignant neoplasm of breast: Secondary | ICD-10-CM

## 2024-02-16 ENCOUNTER — Ambulatory Visit
Admission: RE | Admit: 2024-02-16 | Discharge: 2024-02-16 | Disposition: A | Discharge status: Home / Self Care | Payer: Self-pay | Source: Ambulatory Visit | Attending: Internal Medicine | Admitting: Internal Medicine

## 2024-02-16 DIAGNOSIS — Z1231 Encounter for screening mammogram for malignant neoplasm of breast: Secondary | ICD-10-CM
